# Patient Record
Sex: Male | Born: 1960 | Race: Black or African American | Hispanic: No | Marital: Married | Smoking: Former smoker
Health system: Southern US, Community
[De-identification: ages and names within clinical notes are randomized; demographics above are authoritative.]

## PROBLEM LIST (undated history)

## (undated) DIAGNOSIS — I10 Essential (primary) hypertension: Secondary | ICD-10-CM

## (undated) DIAGNOSIS — E119 Type 2 diabetes mellitus without complications: Secondary | ICD-10-CM

## (undated) DIAGNOSIS — I639 Cerebral infarction, unspecified: Secondary | ICD-10-CM

## (undated) DIAGNOSIS — T148XXA Other injury of unspecified body region, initial encounter: Secondary | ICD-10-CM

## (undated) DIAGNOSIS — J45909 Unspecified asthma, uncomplicated: Secondary | ICD-10-CM

## (undated) HISTORY — PX: KNEE SURGERY: SHX244

---

## 2016-08-23 ENCOUNTER — Emergency Department (HOSPITAL_COMMUNITY): Payer: Medicare Other

## 2016-08-23 ENCOUNTER — Encounter (HOSPITAL_COMMUNITY): Payer: Self-pay | Admitting: Certified Nurse Midwife

## 2016-08-23 ENCOUNTER — Emergency Department (HOSPITAL_COMMUNITY)
Admission: EM | Admit: 2016-08-23 | Discharge: 2016-08-23 | Disposition: A | Discharge status: Home / Self Care | Payer: Medicare Other | Attending: Emergency Medicine | Admitting: Emergency Medicine

## 2016-08-23 DIAGNOSIS — J45909 Unspecified asthma, uncomplicated: Secondary | ICD-10-CM | POA: Insufficient documentation

## 2016-08-23 DIAGNOSIS — R531 Weakness: Secondary | ICD-10-CM | POA: Diagnosis not present

## 2016-08-23 DIAGNOSIS — Z87891 Personal history of nicotine dependence: Secondary | ICD-10-CM | POA: Diagnosis not present

## 2016-08-23 DIAGNOSIS — R4182 Altered mental status, unspecified: Secondary | ICD-10-CM | POA: Diagnosis present

## 2016-08-23 DIAGNOSIS — E119 Type 2 diabetes mellitus without complications: Secondary | ICD-10-CM | POA: Insufficient documentation

## 2016-08-23 DIAGNOSIS — F339 Major depressive disorder, recurrent, unspecified: Secondary | ICD-10-CM | POA: Insufficient documentation

## 2016-08-23 DIAGNOSIS — R791 Abnormal coagulation profile: Secondary | ICD-10-CM | POA: Insufficient documentation

## 2016-08-23 DIAGNOSIS — F331 Major depressive disorder, recurrent, moderate: Secondary | ICD-10-CM

## 2016-08-23 DIAGNOSIS — I10 Essential (primary) hypertension: Secondary | ICD-10-CM | POA: Insufficient documentation

## 2016-08-23 HISTORY — DX: Unspecified asthma, uncomplicated: J45.909

## 2016-08-23 HISTORY — DX: Other injury of unspecified body region, initial encounter: T14.8XXA

## 2016-08-23 HISTORY — DX: Cerebral infarction, unspecified: I63.9

## 2016-08-23 HISTORY — DX: Essential (primary) hypertension: I10

## 2016-08-23 HISTORY — DX: Type 2 diabetes mellitus without complications: E11.9

## 2016-08-23 LAB — DIFFERENTIAL
BASOS ABS: 0 10*3/uL (ref 0.0–0.1)
BASOS PCT: 0 %
EOS ABS: 0 10*3/uL (ref 0.0–0.7)
EOS PCT: 1 %
LYMPHS ABS: 2.2 10*3/uL (ref 0.7–4.0)
Lymphocytes Relative: 35 %
Monocytes Absolute: 0.4 10*3/uL (ref 0.1–1.0)
Monocytes Relative: 6 %
NEUTROS PCT: 58 %
Neutro Abs: 3.6 10*3/uL (ref 1.7–7.7)

## 2016-08-23 LAB — PROTIME-INR
INR: 1.08
PROTHROMBIN TIME: 14 s (ref 11.4–15.2)

## 2016-08-23 LAB — CBG MONITORING, ED: GLUCOSE-CAPILLARY: 186 mg/dL — AB (ref 65–99)

## 2016-08-23 LAB — COMPREHENSIVE METABOLIC PANEL
ALBUMIN: 4.5 g/dL (ref 3.5–5.0)
ALT: 36 U/L (ref 17–63)
ANION GAP: 11 (ref 5–15)
AST: 34 U/L (ref 15–41)
Alkaline Phosphatase: 59 U/L (ref 38–126)
BUN: 10 mg/dL (ref 6–20)
CHLORIDE: 102 mmol/L (ref 101–111)
CO2: 23 mmol/L (ref 22–32)
Calcium: 9.4 mg/dL (ref 8.9–10.3)
Creatinine, Ser: 1.14 mg/dL (ref 0.61–1.24)
GFR calc Af Amer: >60 mL/min (ref >60)
GFR calc non Af Amer: >60 mL/min (ref >60)
GLUCOSE: 218 mg/dL — AB (ref 65–99)
POTASSIUM: 3.9 mmol/L (ref 3.5–5.1)
SODIUM: 136 mmol/L (ref 135–145)
TOTAL PROTEIN: 7.2 g/dL (ref 6.5–8.1)
Total Bilirubin: 0.4 mg/dL (ref 0.3–1.2)

## 2016-08-23 LAB — CBC
HCT: 42.4 % (ref 39.0–52.0)
HEMOGLOBIN: 14.4 g/dL (ref 13.0–17.0)
MCH: 30.1 pg (ref 26.0–34.0)
MCHC: 34 g/dL (ref 30.0–36.0)
MCV: 88.7 fL (ref 78.0–100.0)
PLATELETS: 172 10*3/uL (ref 150–400)
RBC: 4.78 MIL/uL (ref 4.22–5.81)
RDW: 12.4 % (ref 11.5–15.5)
WBC: 6.3 10*3/uL (ref 4.0–10.5)

## 2016-08-23 LAB — I-STAT CHEM 8, ED
BUN: 14 mg/dL (ref 6–20)
CALCIUM ION: 1.07 mmol/L — AB (ref 1.15–1.40)
CHLORIDE: 103 mmol/L (ref 101–111)
Creatinine, Ser: 1.2 mg/dL (ref 0.61–1.24)
Glucose, Bld: 213 mg/dL — ABNORMAL HIGH (ref 65–99)
HEMATOCRIT: 44 % (ref 39.0–52.0)
Hemoglobin: 15 g/dL (ref 13.0–17.0)
Potassium: 3.8 mmol/L (ref 3.5–5.1)
SODIUM: 139 mmol/L (ref 135–145)
TCO2: 24 mmol/L (ref 0–100)

## 2016-08-23 LAB — I-STAT TROPONIN, ED: Troponin i, poc: 0 ng/mL (ref 0.00–0.08)

## 2016-08-23 LAB — APTT: APTT: 27 s (ref 24–36)

## 2016-08-23 MED ORDER — LORAZEPAM 2 MG/ML IJ SOLN
1.0000 mg | INTRAMUSCULAR | Status: DC | PRN
  Administered 2016-08-23: 1 mg via INTRAVENOUS
  Filled 2016-08-23: qty 1

## 2016-08-23 MED ORDER — IOPAMIDOL (ISOVUE-370) INJECTION 76%
INTRAVENOUS | Status: AC
  Administered 2016-08-23: 50 mL
  Filled 2016-08-23: qty 50

## 2016-08-23 NOTE — ED Triage Notes (Signed)
Pt arrives via [facility] for a Code Stroke. Pt was eating at [facility] and a sudden onset of right sided weakness and slurred speech. Pt has a hx of stroke x3.

## 2016-08-23 NOTE — ED Notes (Signed)
Pt departed in NAD.  

## 2016-08-23 NOTE — ED Provider Notes (Addendum)
[facility]-EMERGENCY DEPT Provider Note   CSN: [id number] Arrival date & time: 08/23/16  1548   An emergency department physician performed an initial assessment on this suspected stroke patient at 4.  History   Chief Complaint Chief Complaint  Patient presents with  . Altered Mental Status    HPI Jocsan Mcginley is a 55 y.o. male. Last time known well was approximately 30 minutes before arrival while at a restaurant.  She has history of previous left hemispheric CVA. Has some residual right-sided weakness of his leg greater than his arm. He walks with a cane. They read a restaurant today. He stated that he was feeling "strange" he went to the bathroom. Family went with him. They felt like he was a little weaker on the right side. He states she's been under "a lot of stress". He was brought via EMS for evaluation of possible stroke.    HPI  Past Medical History:  Diagnosis Date  . Asthma   . Diabetes mellitus without complication (HCC)   . Hypertension   . Nerve damage   . Stroke [facility]     There are no active problems to display for this patient.   Past Surgical History:  Procedure Laterality Date  . KNEE SURGERY Right        Home Medications    Prior to Admission medications   Not on File    Family History Family History  Problem Relation Age of Onset  . Diabetes Mother   . Hypertension Father   . Cancer Father   . Hypertension Brother     Social History Social History  Substance Use Topics  . Smoking status: Former Games developer  . Smokeless tobacco: Never Used  . Alcohol use No     Allergies   Patient has no known allergies.   Review of Systems Review of Systems  Constitutional: Negative for appetite change, chills, diaphoresis, fatigue and fever.  HENT: Negative for mouth sores, sore throat and trouble swallowing.   Eyes: Negative for visual disturbance.  Respiratory: Negative for cough, chest tightness, shortness of breath and wheezing.     Cardiovascular: Negative for chest pain.  Gastrointestinal: Negative for abdominal distention, abdominal pain, diarrhea, nausea and vomiting.  Endocrine: Negative for polydipsia, polyphagia and polyuria.  Genitourinary: Negative for dysuria, frequency and hematuria.  Musculoskeletal: Negative for gait problem.  Skin: Negative for color change, pallor and rash.  Neurological: Positive for weakness. Negative for dizziness, syncope, light-headedness and headaches.       Right-sided weakness at baseline. Uncertain if worse today.  Hematological: Does not bruise/bleed easily.  Psychiatric/Behavioral: Positive for sleep disturbance. Negative for behavioral problems and confusion. The patient is nervous/anxious.      Physical Exam Updated Vital Signs BP 137/85   Pulse 71   Temp 98.1 F (36.7 C)   Resp 24   Ht 6' 2.5" (1.892 m)   Wt 238 lb 8 oz (108.2 kg)   SpO2 98%   BMI 30.21 kg/m   Physical Exam  Constitutional: He is oriented to person, place, and time. He appears well-developed and well-nourished. No distress.  HENT:  Head: Normocephalic.  Eyes: Conjunctivae are normal. Pupils are equal, round, and reactive to light. No scleral icterus.  Neck: Normal range of motion. Neck supple. No thyromegaly present.  Cardiovascular: Normal rate and regular rhythm.  Exam reveals no gallop and no friction rub.   No murmur heard. Pulmonary/Chest: Effort normal and breath sounds normal. No respiratory distress. He has no wheezes. He  has no rales.  Abdominal: Soft. Bowel sounds are normal. He exhibits no distension. There is no tenderness. There is no rebound.  Musculoskeletal: Normal range of motion.  Neurological: He is alert and oriented to person, place, and time.  No obvious cranial nerve deficits. Has some subtle right upper externa weakness and some right lower extremity weakness 4/5 to both. Patient states they feel at his baseline.  Skin: Skin is warm and dry. No rash noted.   Psychiatric: He has a normal mood and affect. His behavior is normal.     ED Treatments / Results  Labs (all labs ordered are listed, but only abnormal results are displayed) Labs Reviewed  COMPREHENSIVE METABOLIC PANEL - Abnormal; Notable for the following:       Result Value   Glucose, Bld 218 (*)    All other components within normal limits  CBG MONITORING, ED - Abnormal; Notable for the following:    Glucose-Capillary 186 (*)    All other components within normal limits  I-STAT CHEM 8, ED - Abnormal; Notable for the following:    Glucose, Bld 213 (*)    Calcium, Ion 1.07 (*)    All other components within normal limits  PROTIME-INR  APTT  CBC  DIFFERENTIAL  I-STAT TROPOININ, ED    EKG  EKG Interpretation None       Radiology Ct Angio Head W Or Wo Contrast  Result Date: 08/23/2016 CLINICAL DATA:  RIGHT-sided weakness, stuttering. Three prior strokes. History of hypertension and diabetes. EXAM: CT ANGIOGRAPHY HEAD AND NECK TECHNIQUE: Multidetector CT imaging of the head and neck was performed using the standard protocol during bolus administration of intravenous contrast. Multiplanar CT image reconstructions and MIPs were obtained to evaluate the vascular anatomy. Carotid stenosis measurements (when applicable) are obtained utilizing NASCET criteria, using the distal internal carotid diameter as the denominator. CONTRAST:  50 cc Isovue 370 COMPARISON:  None. FINDINGS: CTA NECK AORTIC ARCH: Normal appearance of the thoracic arch, 2 vessel arch is a normal variant. The origins of the innominate, left Common carotid artery and subclavian artery are widely patent. RIGHT CAROTID SYSTEM: Common carotid artery is widely patent, coursing in a straight line fashion. Minimal intimal thickening RIGHT Common carotid artery. Normal appearance of the carotid bifurcation without hemodynamically significant stenosis by NASCET criteria. Minimal intimal thickening and eccentric calcific  atherosclerosis. Normal appearance of the included internal carotid artery. LEFT CAROTID SYSTEM: Common carotid artery is widely patent, coursing in a straight line fashion. Mild eccentric intimal thickening of the LEFT Common carotid artery. Normal appearance of the carotid bifurcation without hemodynamically significant stenosis by NASCET criteria. Minimal intimal thickening and eccentric calcific atherosclerosis. Normal appearance of the included internal carotid artery. VERTEBRAL ARTERIES:Codominant vertebral artery's. Vertebral arteries are widely patent bilaterally, mildly tortuous in course associated with hypertension. SKELETON: No acute osseous process though bone windows have not been submitted. Mild C5-6 degenerative discs with ventral endplate spurring. No significant osseous canal stenosis or neural foraminal narrowing though not tailored for evaluation. T2 benign hemangioma. OTHER NECK: Soft tissues of the neck are non-acute though, not tailored for evaluation. Subcentimeter nodule LEFT thyroid lobe below size followup recommendations. CTA HEAD ANTERIOR CIRCULATION: Normal appearance of the cervical internal carotid arteries, petrous, cavernous and supra clinoid internal carotid arteries. 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 Patent anterior and middle cerebral arteries. Mild luminal irregularity of the anterior middle cerebral arteries. No large vessel occlusion, hemodynamically  significant stenosis, dissection, contrast extravasation. POSTERIOR CIRCULATION: Normal appearance of the vertebral arteries, vertebrobasilar junction and basilar artery, as well as main branch vessels. Normal appearance of the posterior cerebral arteries. Small bilateral posterior communicating arteries present. Mild luminal irregularity of the posterior cerebral arteries. No large vessel occlusion, hemodynamically significant stenosis,  dissection, contrast extravasation or aneurysm. VENOUS SINUSES: Major dural venous sinuses are patent though not tailored for evaluation on this angiographic examination. ANATOMIC VARIANTS: None. DELAYED PHASE: Not performed. IMPRESSION: CTA NECK: Mild atherosclerosis without hemodynamically significant stenosis or acute vascular process. CTA HEAD:  No emergent large vessel occlusion or severe stenosis. Mild luminal irregularity of the intracranial vessels compatible with atherosclerosis. 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 Preliminary results discussed with and reconfirmed by Dr.MCNEILL [facility] on 08/23/2016 at 4:19 pm. Electronically Signed   By: Awilda Metro M.D.   On: 08/23/2016 16:40   Ct Angio Neck W Or Wo Contrast  Result Date: 08/23/2016 CLINICAL DATA:  RIGHT-sided weakness, stuttering. Three prior strokes. History of hypertension and diabetes. EXAM: CT ANGIOGRAPHY HEAD AND NECK TECHNIQUE: Multidetector CT imaging of the head and neck was performed using the standard protocol during bolus administration of intravenous contrast. Multiplanar CT image reconstructions and MIPs were obtained to evaluate the vascular anatomy. Carotid stenosis measurements (when applicable) are obtained utilizing NASCET criteria, using the distal internal carotid diameter as the denominator. CONTRAST:  50 cc Isovue 370 COMPARISON:  None. FINDINGS: CTA NECK AORTIC ARCH: Normal appearance of the thoracic arch, 2 vessel arch is a normal variant. The origins of the innominate, left Common carotid artery and subclavian artery are widely patent. RIGHT CAROTID SYSTEM: Common carotid artery is widely patent, coursing in a straight line fashion. Minimal intimal thickening RIGHT Common carotid artery. Normal appearance of the carotid bifurcation without hemodynamically significant stenosis by NASCET criteria. Minimal intimal thickening and eccentric calcific atherosclerosis. Normal appearance of the included internal  carotid artery. LEFT CAROTID SYSTEM: Common carotid artery is widely patent, coursing in a straight line fashion. Mild eccentric intimal thickening of the LEFT Common carotid artery. Normal appearance of the carotid bifurcation without hemodynamically significant stenosis by NASCET criteria. Minimal intimal thickening and eccentric calcific atherosclerosis. Normal appearance of the included internal carotid artery. VERTEBRAL ARTERIES:Codominant vertebral artery's. Vertebral arteries are widely patent bilaterally, mildly tortuous in course associated with hypertension. SKELETON: No acute osseous process though bone windows have not been submitted. Mild C5-6 degenerative discs with ventral endplate spurring. No significant osseous canal stenosis or neural foraminal narrowing though not tailored for evaluation. T2 benign hemangioma. OTHER NECK: Soft tissues of the neck are non-acute though, not tailored for evaluation. Subcentimeter nodule LEFT thyroid lobe below size followup recommendations. CTA HEAD ANTERIOR CIRCULATION: Normal appearance of the cervical internal carotid arteries, petrous, cavernous and supra clinoid internal carotid arteries. 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 Patent anterior and middle cerebral arteries. Mild luminal irregularity of the anterior middle cerebral arteries. No large vessel occlusion, hemodynamically significant stenosis, dissection, contrast extravasation. POSTERIOR CIRCULATION: Normal appearance of the vertebral arteries, vertebrobasilar junction and basilar artery, as well as main branch vessels. Normal appearance of the posterior cerebral arteries. Small bilateral posterior communicating arteries present. Mild luminal irregularity of the posterior cerebral arteries. No large vessel occlusion, hemodynamically significant stenosis, dissection, contrast extravasation or aneurysm. VENOUS SINUSES:  Major dural venous sinuses are patent though not tailored for evaluation on this angiographic examination. ANATOMIC VARIANTS: None. DELAYED PHASE: Not performed.  IMPRESSION: CTA NECK: Mild atherosclerosis without hemodynamically significant stenosis or acute vascular process. CTA HEAD:  No emergent large vessel occlusion or severe stenosis. Mild luminal irregularity of the intracranial vessels compatible with atherosclerosis. 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 Preliminary results discussed with and reconfirmed by Dr[facility] on 08/23/2016 at 4:19 pm. Electronically Signed   By: Awilda Metroourtnay  Bloomer M.D.   On: 08/23/2016 16:40   Ct Head Code Stroke W/o Cm  Result Date: 08/23/2016 CLINICAL DATA:  Code stroke.  RIGHT-sided weakness. EXAM: CT HEAD WITHOUT CONTRAST TECHNIQUE: Contiguous axial images were obtained from the base of the skull through the vertex without intravenous contrast. COMPARISON:  None. FINDINGS: BRAIN: The ventricles and sulci are normal. No intraparenchymal hemorrhage, mass effect nor midline shift. No acute large vascular territory infarcts. No abnormal extra-axial fluid collections. Basal cisterns are patent. VASCULAR: The slightly dense RIGHT carotid terminus. SKULL/SOFT TISSUES: No skull fracture. No significant soft tissue swelling. ORBITS/SINUSES: The included ocular globes and orbital contents are normal.The mastoid aircells and included paranasal sinuses are well-aerated. OTHER: None. ASPECTS [facility]([place] Stroke Program Early CT Score) - Ganglionic level infarction (caudate, lentiform nuclei, internal capsule, insula, M1-M3 cortex): 7 - Supraganglionic infarction (M4-M6 cortex): 3 Total score (0-10 with 10 being normal): 10 IMPRESSION: 1. Negative CT HEAD. 2. ASPECTS is 10. Critical Value/emergent results were called by telephone at the time of interpretation on 08/23/2016 at 4:19 pm to Dr. Amada JupiterKirkpatrick, Neurology, who verbally acknowledged these results. Electronically Signed    By: Awilda Metroourtnay  Bloomer M.D.   On: 08/23/2016 16:17    Procedures Procedures (including critical care time)  Medications Ordered in ED Medications  LORazepam (ATIVAN) injection 1 mg (1 mg Intravenous Given 08/23/16 1723)  iopamidol (ISOVUE-370) 76 % injection (50 mLs  Contrast Given 08/23/16 1600)     Initial Impression / Assessment and Plan / ED Course  I have reviewed the triage vital signs and the nursing notes.  Pertinent labs & imaging results that were available during my care of the patient were reviewed by me and considered in my medical decision making (see chart for details).  Clinical Course     Patient with baseline right-sided weakness and anxiety which may be exacerbated his symptoms. Seen by neurology. Appreciate Dr. Petra KubaKilpatrick input. Will undergo MRI. If normal, would be a candidate for early discharge.  Final Clinical Impressions(s) / ED Diagnoses   Final diagnoses:  Right sided weakness  Weak    Reevaluation of discussed anxiety and psychiatric factors as contribute in fractured symptoms today. He states that the family her graduated today. He became emotionally distraught because he has been out of work since his stroke symptoms started 2004. Has not been able before I guess her presents for birthdays or Christmas her medication such as today. He is tearful. He states he is not sleeping is not eating. He is not actively suicidal. I offered something for sleep and outpatient follow-up versus discussion with behavioral health staff tonight. He states he would "really like to talk to someone".  New Prescriptions New Prescriptions   No medications on file      20:52:  Patient has had a change of mind. He states he would really rather C7 as an outpatient. He is not suicidal. I think he is safe for outpatient follow-up. [facility]. Rolland PorterMark Chaselyn Nanney, MD 08/23/16 2011    Rolland PorterMark Blaize Epple, MD 08/23/16 2052

## 2016-08-23 NOTE — BH Assessment (Signed)
Clinician attempted TTS assessment and is experiencing difficulties connected. Per Marquita PalmsMario, RN pt is also speaking of not wanting to proceed with TTS consult. Marquita PalmsMario, RN to contact clinician after troubleshooting and speaking with pt.

## 2016-08-23 NOTE — Discharge Instructions (Signed)
Recheck with [facility]--outpatient Psychiatric clinic.

## 2016-08-23 NOTE — Consult Note (Addendum)
Referring Physician: Dr Fayrene FearingJames    Chief Complaint: Right-sided numbness and speech difficulties  HPI: Chad Chandler is an 55 y.o. 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 He had attendeded a graduation ceremony with his daughter. Afterwards they went out to eat and during the meal the patient reported a burning sensation in the right side of his face. His daughter also noted that his hand was contracting into a fist and the patient did not appear to have control over this. He tried to open his right hand with his left hand. He has residual right-sided weakness from his previous strokes but feels he is now weaker on the right. He has stuttering speech and reports some word finding difficulties all of which are new per patient history.He has no history of seizures. Almost all of his medical care has been in [place]. The patient takes aspirin 325 mg and Plavix 75 mg daily. He denies missing any doses.  Date last known well: Date: 08/23/2016 Time last known well: Time: 14:50 tPA Given: No: TPA was not administered secondary to history of a previous hemorrhagic stroke and minimal new deficits.  Past medical history - Diabetes mellitus, hypertension,  hyperlipidemia, previous strokes one of which involved bleeding, residual right-sided weakness, dysarthria, anxiety and depression. He is claustrophobic.  Surgical history - the patient has had right knee surgery  Family history - the patient's daughter is not aware of any family history of strokes.   Social History: The patient lives alone in [place]. He is a former smoker. He does not use alcohol. He does not use drugs.  Allergies: The patient is allergic to pumpkin. He reports no drug  allergies or allergies to contrast. He is intolerant to Aggrenox secondary to headaches.   Medications: Obtained from records faxed from no follow-up healthcare dated 07/23/2016:  Albuterol inhaler 2 puffs every 6 hours as needed Elavil 25 mg at bedtime Aspirin 325 mg daily  Lipitor 40 mg at bedtime  Plavix 75 mg daily Farxiga 10 mg daily Cymbalta 30 mg daily Januvia 100 mg daily   lisinopril hydrochlorothiazide 20/12.5 - 1 each morning Metformin ER 500 mg with breakfast Multivitamin with minerals 1 daily Prilosec 20 mg daily Pravachol 40 mg daily Liver, 300 mg twice daily Zantac 150 mg daily  300 mg twice daily Tegretol  Per notes Lipitor, Carbatrol, and Januvia recently discontinued.    ROS: History obtained from the patient  General ROS: negative for - chills, fatigue, fever, night sweats, weight gain or weight loss Psychological ROS: negative for - behavioral disorder, hallucinations, memory difficulties, mood swings or suicidal ideation. Positive for recent increased stress, anxiety, tearfulness, and depression recently. Ophthalmic ROS: negative for - blurry vision, double vision, eye pain or loss of vision ENT ROS: negative for - epistaxis, nasal discharge, oral lesions, sore throat, tinnitus or vertigo Allergy and Immunology ROS: negative for - hives or itchy/watery eyes Hematological and Lymphatic ROS: negative for - bleeding problems, bruising or swollen lymph nodes Endocrine ROS: negative for - galactorrhea, hair pattern changes, polydipsia/polyuria or temperature intolerance Respiratory ROS: negative for - cough, hemoptysis, shortness of breath or wheezing Cardiovascular ROS: negative for - chest pain, dyspnea on exertion, edema or irregular heartbeat Gastrointestinal ROS: negative for - abdominal pain, diarrhea, hematemesis, nausea/vomiting or stool incontinence Genito-Urinary  ROS: negative for - dysuria, hematuria, incontinence or urinary  frequency/urgency Musculoskeletal ROS: negative for - joint swelling or muscular weakness Neurological ROS: as noted in HPI Dermatological ROS: negative for rash and skin lesion changes   Physical Examination: There were no vitals taken for this visit.  General -  Heart - Regular rate and rhythm - no murmer Lungs - Clear to auscultation Abdomen - Soft - non tender Extremities - Distal pulses intact - no edema Skin - Warm and dry  Neurologic Examination:   Mental Status:  Alert, oriented, thought content appropriate. Speech - mild dysarthria with occasional stuttering. Able to follow 3 step commands. Cranial Nerves:  II: Discs not visualized; Visual fields grossly normal, pupils equal, round, reactive to light and accommodation  III,IV, VI: Mild right ptosis, extra-ocular motions intact bilaterally  V,VII: smile asymmetric with right facial weakness, light touch decreased right face VIII: hearing normal bilaterally  IX,X: gag reflex present  - the patient failed a swallowing evaluation with the nurses in the emergency department XI: bilateral shoulder shrug slightly weak on right XII: midline tongue extension  Motor:  Right : Upper extremity 3/5 Left: Upper extremity 5/5  Lower extremity 3+/5 Lower extremity 5/5  Tone and bulk:normal tone throughout; no atrophy noted  Sensory: Decreased light sensation on the right Deep Tendon Reflexes: 2+ and symmetric throughout  Plantars:  Right: Mute Left: downgoing  Cerebellar:  Unable to perform finger to nose or heel-to-shin on the right secondary to weakness CV: pulses palpable throughout    Laboratory Studies:  Basic Metabolic Panel:  Recent Labs Lab 08/23/16 1555  NA 139  K 3.8  CL 103  GLUCOSE 213*  BUN 14  CREATININE 1.20    Liver Function Tests: No results for input(s): AST, ALT, ALKPHOS, BILITOT, PROT, ALBUMIN in the last 168 hours. No results for input(s): LIPASE, AMYLASE in the last 168 hours. No results for  input(s): AMMONIA in the last 168 hours.  CBC:  Recent Labs Lab 08/23/16 1549 08/23/16 1555  WBC 6.3  --   NEUTROABS 3.6  --   HGB 14.4 15.0  HCT 42.4 44.0  MCV 88.7  --   PLT 172  --     Cardiac Enzymes: No results for input(s): CKTOTAL, CKMB, CKMBINDEX, TROPONINI in the last 168 hours.  BNP: Invalid input(s): POCBNP  CBG: No results for input(s): GLUCAP in the last 168 hours.  Microbiology: No results found for this or any previous visit.  Coagulation Studies: No results for input(s): LABPROT, INR in the last 72 hours.  Urinalysis: No results for input(s): COLORURINE, LABSPEC, PHURINE, GLUCOSEU, HGBUR, BILIRUBINUR, KETONESUR, PROTEINUR, UROBILINOGEN, NITRITE, LEUKOCYTESUR in the last 168 hours.  Invalid input(s): APPERANCEUR  Lipid Panel: No results found for: CHOL, TRIG, HDL, CHOLHDL, VLDL, LDLCALC  HgbA1C: No results found for: HGBA1C  Urine Drug Screen:  No results found for: LABOPIA, COCAINSCRNUR, LABBENZ, AMPHETMU, THCU, LABBARB  Alcohol Level: No results for input(s): ETH in the last 168 hours.  Other results: EKG: - Sinus rhythm rate 84 bpm. No ischemic changes noted. Please refer to the cardiology reading for complete details.  Imaging:   CT of the head without contrast 08/23/2016 1. Negative CT HEAD. 2. ASPECTS is 10.   CTA Head and Neck 08/23/2016 CTA NECK: Mild atherosclerosis without hemodynamically significant stenosis or acute vascular process.   CTA HEAD:  No emergent large vessel occlusion or severe stenosis. Mild luminal irregularity of the intracranial vessels compatible with atherosclerosis. 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   MRI brain without contrast 08/23/2016 Pending   Assessment: 55 y.o. male with a history of hypertension,  anxiety, depression, hyperlipidemia, diabetes mellitus, and 3 previous strokes with residual right-sided weakness. The patient developed a burning sensation in the right side of his face  while eating lunch associated with a spontaneous, unintentional, closing of his right hand. Speech difficulties were also reported including word finding difficulties and dysarthria. He has been on aspirin 325 mg daily and Plavix 75 mg daily. He denies missing any doses. The patient feels he has increased right-sided weakness as compared to the weakness from his previous stroke .  Stroke Risk Factors - diabetes mellitus, hypertension and previous strokes  Plan:   Further details to follow per Dr. Amada JupiterKirkpatrick. Delton Seeavid Rinehuls PA-C [facility] Pager [phone] 08/23/2016, 4:24 PM   I have seen the patient and reviewed the above note.  He has mild right-sided weakness, with drift in both the arm and leg. He splits midline to both pinprick and vibration on the for head. He has downward drift without pronation in the arm. He has at least 4/5 strength in the arm and leg, though he does not always give full effort. He has give way weakness in the right arm. He has stuttering speech.  Multiple findings that I listed on exam are most consistent with embellishment. He was not a TPA candidate due to her previous history of cranial hemorrhage, though I did not have the records to review this was reported per the patient. Especially given that his symptoms were on the milder side and did not feel that he merited TPA given the history of ICH.  Given that his exam is concerning for embellishment, I do think that I would perform an MRI and if negative I don't think I would repeat stroke workup at this time.  He is at risk for stroke however, and therefore I would rule out ischemic stroke with secondary embellishment with MRI brain.  1) MRI brain, if negative no further workup.  Ritta SlotMcNeill Naveyah Iacovelli, MD [facility] [phone]  If 7pm- 7am, please page neurology on call as listed in AMION.

## 2018-04-21 IMAGING — CT CT ANGIO HEAD
1 of 9 series · 1 of 33 positions shown · IV contrast (Iohexol (Omnipaque 350))
Comparison: None.

CLINICAL DATA: RIGHT-sided weakness, stuttering. Three prior
strokes. History of hypertension and diabetes.

EXAM:
CT ANGIOGRAPHY HEAD AND NECK
TECHNIQUE: Multidetector CT imaging of the head and neck was performed using
the standard protocol during bolus administration of intravenous
contrast. Multiplanar CT image reconstructions and MIPs were
obtained to evaluate the vascular anatomy. Carotid stenosis
measurements (when applicable) are obtained utilizing NASCET
criteria, using the distal internal carotid diameter as the
denominator.
CONTRAST:  50 cc Isovue 370

[Series 200: locator · axial · 0.49mm/px · 1 of 1 slices shown]
[im 1/1  soft-tissue]
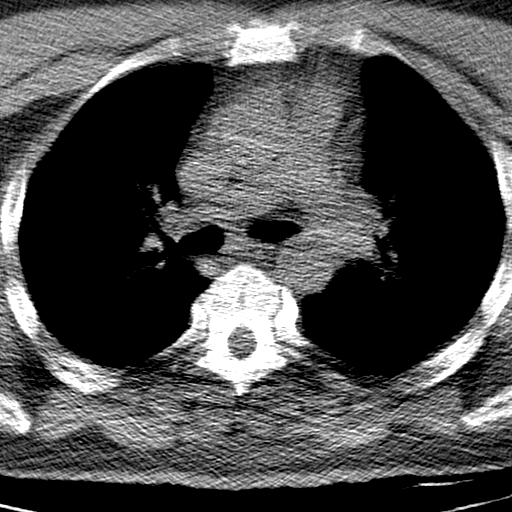

[1 of 33 positions shown; findings below may reference images not displayed]

FINDINGS: CTA NECK

AORTIC ARCH: Normal appearance of the thoracic arch, 2 vessel arch
is a normal variant. The origins of the innominate, left Common
carotid artery and subclavian artery are widely patent.

RIGHT CAROTID SYSTEM: Common carotid artery is widely patent,
coursing in a straight line fashion. Minimal intimal thickening
RIGHT Common carotid artery. Normal appearance of the carotid
bifurcation without hemodynamically significant stenosis by NASCET
criteria. Minimal intimal thickening and eccentric calcific
atherosclerosis. Normal appearance of the included internal carotid
artery.

LEFT CAROTID SYSTEM: Common carotid artery is widely patent,
coursing in a straight line fashion. Mild eccentric intimal
thickening of the LEFT Common carotid artery. Normal appearance of
the carotid bifurcation without hemodynamically significant stenosis
by NASCET criteria. Minimal intimal thickening and eccentric
calcific atherosclerosis. Normal appearance of the included internal
carotid artery.

VERTEBRAL ARTERIES:Codominant vertebral artery's. Vertebral arteries
are widely patent bilaterally, mildly tortuous in course associated
with hypertension.

SKELETON: No acute osseous process though bone windows have not been
submitted. Mild C5-6 degenerative discs with ventral endplate
spurring. No significant osseous canal stenosis or neural foraminal
narrowing though not tailored for evaluation. T2 benign hemangioma.

OTHER NECK: Soft tissues of the neck are non-acute though, not
tailored for evaluation. Subcentimeter nodule LEFT thyroid lobe
below size followup recommendations.

CTA HEAD

ANTERIOR CIRCULATION: Normal appearance of the cervical internal
carotid arteries, petrous, cavernous and supra clinoid internal
carotid arteries. Widely patent anterior communicating artery ; 3 mm
wide necked intact aneurysm medially directed from RIGHT A1-2
junction. Patent anterior and middle cerebral arteries. Mild luminal
irregularity of the anterior middle cerebral arteries.

No large vessel occlusion, hemodynamically significant stenosis,
dissection, contrast extravasation.

POSTERIOR CIRCULATION: Normal appearance of the vertebral arteries,
vertebrobasilar junction and basilar artery, as well as main branch
vessels. Normal appearance of the posterior cerebral arteries. Small
bilateral posterior communicating arteries present. Mild luminal
irregularity of the posterior cerebral arteries.

No large vessel occlusion, hemodynamically significant stenosis,
dissection, contrast extravasation or aneurysm.

VENOUS SINUSES: Major dural venous sinuses are patent though not
tailored for evaluation on this angiographic examination.

ANATOMIC VARIANTS: None.

DELAYED PHASE: Not performed.
IMPRESSION: CTA NECK: Mild atherosclerosis without hemodynamically significant
stenosis or acute vascular process.

CTA HEAD:  No emergent large vessel occlusion or severe stenosis.

Mild luminal irregularity of the intracranial vessels compatible
with atherosclerosis.

3 mm intact RIGHT A1-2 junction aneurysm.

Preliminary results discussed with and reconfirmed by Dr.XILDHIBAN
ROGELMA on 08/23/2016 at [DATE].
